# Patient Record
Sex: Female | Born: 1960 | Race: White | Hispanic: No | Smoking: Current every day smoker
Health system: Southern US, Community
[De-identification: ages and names within clinical notes are randomized; demographics above are authoritative.]

## PROBLEM LIST (undated history)

## (undated) DIAGNOSIS — K219 Gastro-esophageal reflux disease without esophagitis: Secondary | ICD-10-CM

## (undated) HISTORY — PX: TONSILLECTOMY: SUR1361

## (undated) HISTORY — PX: CERVICAL DISCECTOMY: SHX98

---

## 2006-11-23 ENCOUNTER — Ambulatory Visit (HOSPITAL_COMMUNITY): Payer: Self-pay | Admitting: Psychiatry

## 2008-08-20 ENCOUNTER — Encounter: Admission: RE | Admit: 2008-08-20 | Discharge: 2008-08-20 | Payer: Self-pay | Admitting: Obstetrics and Gynecology

## 2008-10-10 ENCOUNTER — Inpatient Hospital Stay (HOSPITAL_COMMUNITY): Admission: AD | Admit: 2008-10-10 | Discharge: 2008-10-10 | Payer: Self-pay | Admitting: Obstetrics and Gynecology

## 2008-11-23 ENCOUNTER — Encounter: Admission: RE | Admit: 2008-11-23 | Discharge: 2008-11-23 | Payer: Self-pay | Admitting: Orthopedic Surgery

## 2010-03-22 ENCOUNTER — Encounter (HOSPITAL_COMMUNITY): Payer: Self-pay | Admitting: Obstetrics and Gynecology

## 2010-06-06 LAB — DIFFERENTIAL
Basophils Absolute: 0.1 10*3/uL (ref 0.0–0.1)
Eosinophils Relative: 5 % (ref 0–5)
Lymphocytes Relative: 31 % (ref 12–46)
Lymphs Abs: 2.4 10*3/uL (ref 0.7–4.0)
Monocytes Absolute: 0.6 10*3/uL (ref 0.1–1.0)

## 2010-06-06 LAB — CBC
HCT: 41.7 % (ref 36.0–46.0)
Hemoglobin: 14.4 g/dL (ref 12.0–15.0)
RDW: 12.5 % (ref 11.5–15.5)

## 2014-08-18 ENCOUNTER — Ambulatory Visit: Payer: Self-pay | Admitting: Podiatry

## 2014-08-19 ENCOUNTER — Ambulatory Visit (INDEPENDENT_AMBULATORY_CARE_PROVIDER_SITE_OTHER): Payer: 59

## 2014-08-19 ENCOUNTER — Encounter: Payer: Self-pay | Admitting: Podiatry

## 2014-08-19 ENCOUNTER — Ambulatory Visit (INDEPENDENT_AMBULATORY_CARE_PROVIDER_SITE_OTHER): Payer: 59 | Admitting: Podiatry

## 2014-08-19 VITALS — BP 119/75 | HR 87 | Resp 16 | Ht 65.0 in | Wt 178.0 lb

## 2014-08-19 DIAGNOSIS — M722 Plantar fascial fibromatosis: Secondary | ICD-10-CM | POA: Diagnosis not present

## 2014-08-19 MED ORDER — MELOXICAM 7.5 MG PO TABS: 7.5000 mg | ORAL_TABLET | Freq: Every day | ORAL | Status: DC

## 2014-08-19 NOTE — Progress Notes (Signed)
   Subjective:    Patient ID: KIMBERLA HASELTINE, female    DOB: 10/07/1960, 54 y.o.   MRN: [id number]  HPI 54 year old female presents the office today with complaints of left heel pain which is been going on for couple of months. She states that she has pain in the morning which is relieved by ambulation however becoming more throbbing pain more consistently. She denies any history of injury or trauma denies any change or increase activities, onto the symptoms. She denies any numbness or tingling. The pain does not wake her up at night. Denies any swelling or redness. No other complaints at this time.   Review of Systems  All other systems reviewed and are negative.      Objective:   Physical Exam AAO x3, NAD DP/PT pulses palpable bilaterally, CRT less than 3 seconds Protective sensation intact with Simms Weinstein monofilament, vibratory sensation intact, Achilles tendon reflex intact Tenderness to palpation overlying the plantar medial tubercle of the calcaneus to left heel at the insertion of the plantar fascia. There is no pain along the course of plantar fascial within the arch of the foot. There is no pain with lateral compression of the calcaneus or pain the vibratory sensation. No pain on the posterior aspect of the calcaneus or along the course/insertion of the Achilles tendon. There is no overlying edema, erythema, increase in warmth. No other areas of tenderness palpation or pain with vibratory sensation to the foot/ankle. MMT 5/5, ROM WNL No open lesions or pre-ulcerative lesions are identified. No pain with calf compression, swelling, warmth, erythema.      Assessment & Plan:  54 year old female with left heel pain, likely plantar fasciitis -X-rays were obtained and reviewed with the patient.  -Discussed steroid injection however patient was told at this time. Prescribed mobic. Discussed side effects of the medication and directed to stop if any are to occur and call the office.    -Dispensed plantar fascial brace -Ice and stretching activities on a consistent basis. -Discussed shoe gear modifications and orthotics. -Treatment options discussed including all alternatives, risks, and complications -Follow-up 3 weeks or sooner if any problems arise. In the meantime, encouraged to call the office with any questions, concerns, change in symptoms.   Ovid Curd, DPM

## 2014-08-19 NOTE — Patient Instructions (Signed)
Plantar Fasciitis (Heel Spur Syndrome) with Rehab The plantar fascia is a fibrous, ligament-like, soft-tissue structure that spans the bottom of the foot. Plantar fasciitis is a condition that causes pain in the foot due to inflammation of the tissue. SYMPTOMS   Pain and tenderness on the underneath side of the foot.  Pain that worsens with standing or walking. CAUSES  Plantar fasciitis is caused by irritation and injury to the plantar fascia on the underneath side of the foot. Common mechanisms of injury include:  Direct trauma to bottom of the foot.  Damage to a small nerve that runs under the foot where the main fascia attaches to the heel bone.  Stress placed on the plantar fascia due to bone spurs. RISK INCREASES WITH:   Activities that place stress on the plantar fascia (running, jumping, pivoting, or cutting).  Poor strength and flexibility.  Improperly fitted shoes.  Tight calf muscles.  Flat feet.  Failure to warm-up properly before activity.  Obesity. PREVENTION  Warm up and stretch properly before activity.  Allow for adequate recovery between workouts.  Maintain physical fitness:  Strength, flexibility, and endurance.  Cardiovascular fitness.  Maintain a health body weight.  Avoid stress on the plantar fascia.  Wear properly fitted shoes, including arch supports for individuals who have flat feet. PROGNOSIS  If treated properly, then the symptoms of plantar fasciitis usually resolve without surgery. However, occasionally surgery is necessary. RELATED COMPLICATIONS   Recurrent symptoms that may result in a chronic condition.  Problems of the lower back that are caused by compensating for the injury, such as limping.  Pain or weakness of the foot during push-off following surgery.  Chronic inflammation, scarring, and partial or complete fascia tear, occurring more often from repeated injections. TREATMENT  Treatment initially involves the use of  ice and medication to help reduce pain and inflammation. The use of strengthening and stretching exercises may help reduce pain with activity, especially stretches of the Achilles tendon. These exercises may be performed at home or with a therapist. Your caregiver may recommend that you use heel cups of arch supports to help reduce stress on the plantar fascia. Occasionally, corticosteroid injections are given to reduce inflammation. If symptoms persist for greater than 6 months despite non-surgical (conservative), then surgery may be recommended.  MEDICATION   If pain medication is necessary, then nonsteroidal anti-inflammatory medications, such as aspirin and ibuprofen, or other minor pain relievers, such as acetaminophen, are often recommended.  Do not take pain medication within 7 days before surgery.  Prescription pain relievers may be given if deemed necessary by your caregiver. Use only as directed and only as much as you need.  Corticosteroid injections may be given by your caregiver. These injections should be reserved for the most serious cases, because they may only be given a certain number of times. HEAT AND COLD  Cold treatment (icing) relieves pain and reduces inflammation. Cold treatment should be applied for 10 to 15 minutes every 2 to 3 hours for inflammation and pain and immediately after any activity that aggravates your symptoms. Use ice packs or massage the area with a piece of ice (ice massage).  Heat treatment may be used prior to performing the stretching and strengthening activities prescribed by your caregiver, physical therapist, or athletic trainer. Use a heat pack or soak the injury in warm water. SEEK IMMEDIATE MEDICAL CARE IF:  Treatment seems to offer no benefit, or the condition worsens.  Any medications produce adverse side effects. EXERCISES RANGE   OF MOTION (ROM) AND STRETCHING EXERCISES - Plantar Fasciitis (Heel Spur Syndrome) These exercises may help you  when beginning to rehabilitate your injury. Your symptoms may resolve with or without further involvement from your physician, physical therapist or athletic trainer. While completing these exercises, remember:   Restoring tissue flexibility helps normal motion to return to the joints. This allows healthier, less painful movement and activity.  An effective stretch should be held for at least 30 seconds.  A stretch should never be painful. You should only feel a gentle lengthening or release in the stretched tissue. RANGE OF MOTION - Toe Extension, Flexion  Sit with your right / left leg crossed over your opposite knee.  Grasp your toes and gently pull them back toward the top of your foot. You should feel a stretch on the bottom of your toes and/or foot.  Hold this stretch for __________ seconds.  Now, gently pull your toes toward the bottom of your foot. You should feel a stretch on the top of your toes and or foot.  Hold this stretch for __________ seconds. Repeat __________ times. Complete this stretch __________ times per day.  RANGE OF MOTION - Ankle Dorsiflexion, Active Assisted  Remove shoes and sit on a chair that is preferably not on a carpeted surface.  Place right / left foot under knee. Extend your opposite leg for support.  Keeping your heel down, slide your right / left foot back toward the chair until you feel a stretch at your ankle or calf. If you do not feel a stretch, slide your bottom forward to the edge of the chair, while still keeping your heel down.  Hold this stretch for __________ seconds. Repeat __________ times. Complete this stretch __________ times per day.  STRETCH - Gastroc, Standing  Place hands on wall.  Extend right / left leg, keeping the front knee somewhat bent.  Slightly point your toes inward on your back foot.  Keeping your right / left heel on the floor and your knee straight, shift your weight toward the wall, not allowing your back to  arch.  You should feel a gentle stretch in the right / left calf. Hold this position for __________ seconds. Repeat __________ times. Complete this stretch __________ times per day. STRETCH - Soleus, Standing  Place hands on wall.  Extend right / left leg, keeping the other knee somewhat bent.  Slightly point your toes inward on your back foot.  Keep your right / left heel on the floor, bend your back knee, and slightly shift your weight over the back leg so that you feel a gentle stretch deep in your back calf.  Hold this position for __________ seconds. Repeat __________ times. Complete this stretch __________ times per day. STRETCH - Gastrocsoleus, Standing  Note: This exercise can place a lot of stress on your foot and ankle. Please complete this exercise only if specifically instructed by your caregiver.   Place the ball of your right / left foot on a step, keeping your other foot firmly on the same step.  Hold on to the wall or a rail for balance.  Slowly lift your other foot, allowing your body weight to press your heel down over the edge of the step.  You should feel a stretch in your right / left calf.  Hold this position for __________ seconds.  Repeat this exercise with a slight bend in your right / left knee. Repeat __________ times. Complete this stretch __________ times per day.    STRENGTHENING EXERCISES - Plantar Fasciitis (Heel Spur Syndrome)  These exercises may help you when beginning to rehabilitate your injury. They may resolve your symptoms with or without further involvement from your physician, physical therapist or athletic trainer. While completing these exercises, remember:   Muscles can gain both the endurance and the strength needed for everyday activities through controlled exercises.  Complete these exercises as instructed by your physician, physical therapist or athletic trainer. Progress the resistance and repetitions only as guided. STRENGTH -  Towel Curls  Sit in a chair positioned on a non-carpeted surface.  Place your foot on a towel, keeping your heel on the floor.  Pull the towel toward your heel by only curling your toes. Keep your heel on the floor.  If instructed by your physician, physical therapist or athletic trainer, add ____________________ at the end of the towel. Repeat __________ times. Complete this exercise __________ times per day. STRENGTH - Ankle Inversion  Secure one end of a rubber exercise band/tubing to a fixed object (table, pole). Loop the other end around your foot just before your toes.  Place your fists between your knees. This will focus your strengthening at your ankle.  Slowly, pull your big toe up and in, making sure the band/tubing is positioned to resist the entire motion.  Hold this position for __________ seconds.  Have your muscles resist the band/tubing as it slowly pulls your foot back to the starting position. Repeat __________ times. Complete this exercises __________ times per day.  Document Released: 02/14/2005 Document Revised: 05/09/2011 Document Reviewed: 05/29/2008 ExitCare Patient Information 2015 [employer]. This information is not intended to replace advice given to you by your health care provider. Make sure you discuss any questions you have with your health care provider.  

## 2014-09-09 ENCOUNTER — Ambulatory Visit: Payer: 59 | Admitting: Podiatry

## 2014-11-19 ENCOUNTER — Telehealth: Payer: Self-pay | Admitting: *Deleted

## 2014-11-19 MED ORDER — MELOXICAM 7.5 MG PO TABS: 7.5000 mg | ORAL_TABLET | Freq: Every day | ORAL | Status: AC

## 2014-11-19 NOTE — Telephone Encounter (Signed)
[facility] request refill of pt's Meloxicam.  Dr. Ardelle Anton ordered one refill, pt needs an appt.

## 2014-12-05 NOTE — Telephone Encounter (Signed)
entered in error   

## 2015-04-09 ENCOUNTER — Ambulatory Visit
Admission: EM | Admit: 2015-04-09 | Discharge: 2015-04-09 | Disposition: A | Discharge status: Home / Self Care | Payer: 59 | Attending: Family Medicine | Admitting: Family Medicine

## 2015-04-09 ENCOUNTER — Encounter: Payer: Self-pay | Admitting: Emergency Medicine

## 2015-04-09 DIAGNOSIS — J069 Acute upper respiratory infection, unspecified: Secondary | ICD-10-CM

## 2015-04-09 HISTORY — DX: Gastro-esophageal reflux disease without esophagitis: K21.9

## 2015-04-09 LAB — RAPID INFLUENZA A&B ANTIGENS (ARMC ONLY)
INFLUENZA A (ARMC): NOT DETECTED
INFLUENZA B (ARMC): NOT DETECTED

## 2015-04-09 MED ORDER — BENZONATATE 100 MG PO CAPS: 100.0000 mg | ORAL_CAPSULE | Freq: Three times a day (TID) | ORAL | Status: AC | PRN

## 2015-04-09 MED ORDER — OSELTAMIVIR PHOSPHATE 75 MG PO CAPS: 75.0000 mg | ORAL_CAPSULE | Freq: Two times a day (BID) | ORAL | Status: AC

## 2015-04-09 NOTE — Discharge Instructions (Signed)
Take medication as prescribed. Rest. Drink plenty of fluids. Take over the counter tylenol or ibuprofen as needed for pain or fever.   Follow up with your primary care physician this week as needed. Return to Urgent care for new or worsening concerns.    Upper Respiratory Infection, Adult Most upper respiratory infections (URIs) are a viral infection of the air passages leading to the lungs. A URI affects the nose, throat, and upper air passages. The most common type of URI is nasopharyngitis and is typically referred to as "the common cold." URIs run their course and usually go away on their own. Most of the time, a URI does not require medical attention, but sometimes a bacterial infection in the upper airways can follow a viral infection. This is called a secondary infection. Sinus and middle ear infections are common types of secondary upper respiratory infections. Bacterial pneumonia can also complicate a URI. A URI can worsen asthma and chronic obstructive pulmonary disease (COPD). Sometimes, these complications can require emergency medical care and may be life threatening.  CAUSES Almost all URIs are caused by viruses. A virus is a type of germ and can spread from one person to another.  RISKS FACTORS You may be at risk for a URI if:   You smoke.   You have chronic heart or lung disease.  You have a weakened defense (immune) system.   You are very young or very old.   You have nasal allergies or asthma.  You work in crowded or poorly ventilated areas.  You work in health care facilities or schools. SIGNS AND SYMPTOMS  Symptoms typically develop 2-3 days after you come in contact with a cold virus. Most viral URIs last 7-10 days. However, viral URIs from the influenza virus (flu virus) can last 14-18 days and are typically more severe. Symptoms may include:   Runny or stuffy (congested) nose.   Sneezing.   Cough.   Sore throat.   Headache.   Fatigue.   Fever.    Loss of appetite.   Pain in your forehead, behind your eyes, and over your cheekbones (sinus pain).  Muscle aches.  DIAGNOSIS  Your health care provider may diagnose a URI by:  Physical exam.  Tests to check that your symptoms are not due to another condition such as:  Strep throat.  Sinusitis.  Pneumonia.  Asthma. TREATMENT  A URI goes away on its own with time. It cannot be cured with medicines, but medicines may be prescribed or recommended to relieve symptoms. Medicines may help:  Reduce your fever.  Reduce your cough.  Relieve nasal congestion. HOME CARE INSTRUCTIONS   Take medicines only as directed by your health care provider.   Gargle warm saltwater or take cough drops to comfort your throat as directed by your health care provider.  Use a warm mist humidifier or inhale steam from a shower to increase air moisture. This may make it easier to breathe.  Drink enough fluid to keep your urine clear or pale yellow.   Eat soups and other clear broths and maintain good nutrition.   Rest as needed.   Return to work when your temperature has returned to normal or as your health care provider advises. You may need to stay home longer to avoid infecting others. You can also use a face mask and careful hand washing to prevent spread of the virus.  Increase the usage of your inhaler if you have asthma.   Do not use any  tobacco products, including cigarettes, chewing tobacco, or electronic cigarettes. If you need help quitting, ask your health care provider. PREVENTION  The best way to protect yourself from getting a cold is to practice good hygiene.   Avoid oral or hand contact with people with cold symptoms.   Wash your hands often if contact occurs.  There is no clear evidence that vitamin C, vitamin E, echinacea, or exercise reduces the chance of developing a cold. However, it is always recommended to get plenty of rest, exercise, and practice good  nutrition.  SEEK MEDICAL CARE IF:   You are getting worse rather than better.   Your symptoms are not controlled by medicine.   You have chills.  You have worsening shortness of breath.  You have brown or red mucus.  You have yellow or brown nasal discharge.  You have pain in your face, especially when you bend forward.  You have a fever.  You have swollen neck glands.  You have pain while swallowing.  You have white areas in the back of your throat. SEEK IMMEDIATE MEDICAL CARE IF:   You have severe or persistent:  Headache.  Ear pain.  Sinus pain.  Chest pain.  You have chronic lung disease and any of the following:  Wheezing.  Prolonged cough.  Coughing up blood.  A change in your usual mucus.  You have a stiff neck.  You have changes in your:  Vision.  Hearing.  Thinking.  Mood. MAKE SURE YOU:   Understand these instructions.  Will watch your condition.  Will get help right away if you are not doing well or get worse.   This information is not intended to replace advice given to you by your health care provider. Make sure you discuss any questions you have with your health care provider.   Document Released: 08/10/2000 Document Revised: 07/01/2014 Document Reviewed: 05/22/2013 Elsevier Interactive Patient Education [employer]

## 2015-04-09 NOTE — ED Provider Notes (Signed)
[facility]  ____________________________________________  Time seen: Approximately 9:41 AM  I have reviewed the triage vital signs and the nursing notes.   HISTORY  Chief Complaint Cough and Fever   HPI Adriana Watkins is a 55 y.o. female presents with a complaint of 2 days of runny nose, nasal congestion, cough, body aches and intermittent fever. Patient reports that fever maximum was 102 orally over the night last night. Patient reports that she last took Tylenol at 5 AM this morning. Patient reports that cough intermittently kept her up last night. Reports has continued to eat and drink well.  Denies known sick contacts, reports does work for the [employer]. Denies chest pain or shortness of breath, weakness, dizziness, abdominal pain, dysuria, rash, neck or back pain.  PCP: [facility]   Past Medical History  Diagnosis Date  . GERD (gastroesophageal reflux disease)     There are no active problems to display for this patient.   Past Surgical History  Procedure Laterality Date  . Tonsillectomy    . Cervical discectomy      Current Outpatient Rx  Name  Route  Sig  Dispense  Refill  . meloxicam (MOBIC) 7.5 MG tablet   Oral   Take 1 tablet (7.5 mg total) by mouth daily.   30 tablet   0   . Multiple Vitamins-Minerals (OCUVITE ADULT 50+ PO)   Oral   Take by mouth.         . Omega-3 Fatty Acids (FISH OIL) 1000 MG CAPS   Oral   Take by mouth.         .             Allergies Codeine  History reviewed. No pertinent family history.  Social History Social History  Substance Use Topics  . Smoking status: Current Every Day Smoker  . Smokeless tobacco: Never Used  . Alcohol Use: 0.0 oz/week    0 Standard drinks or equivalent per week    Review of Systems Constitutional: Positive fevers. Eyes: No visual changes. ENT: No sore throat. Positive runny nose, nasal congestion and cough. Cardiovascular: Denies chest pain. Respiratory: Denies  shortness of breath. Gastrointestinal: No abdominal pain.  No nausea, no vomiting.  No diarrhea.  No constipation. Genitourinary: Negative for dysuria. Musculoskeletal: Negative for back pain. Skin: Negative for rash. Neurological: Negative for headaches, focal weakness or numbness.  10-point ROS otherwise negative.  ____________________________________________   PHYSICAL EXAM:  VITAL SIGNS: ED Triage Vitals  Enc Vitals Group     BP 04/09/15 0937 137/90 mmHg     Pulse Rate 04/09/15 0937 88     Resp 04/09/15 0937 16     Temp 04/09/15 0937 98.7 F (37.1 C)     Temp Source 04/09/15 0937 Tympanic     SpO2 04/09/15 0937 97 %     Weight 04/09/15 0937 180 lb (81.647 kg)     Height 04/09/15 0937  (1.651 m)     Head Cir --      Peak Flow --      Pain Score 04/09/15 0939 3     Pain Loc --      Pain Edu? --      Excl. in GC? --     Constitutional: Alert and oriented. Well appearing and in no acute distress. Eyes: Conjunctivae are normal. PERRL. EOMI. Head: Atraumatic. No sinus tenderness to palpation. No swelling. No erythema.  Ears: no erythema, normal TMs bilaterally.   Nose: Nasal  congestion with clear rhinorrhea.  Mouth/Throat: Mucous membranes are moist.  Oropharynx non-erythematous. No tonsillar swelling or exudate. Neck: No stridor.  No cervical spine tenderness to palpation. Hematological/Lymphatic/Immunilogical: No cervical lymphadenopathy. Cardiovascular: Normal rate, regular rhythm. Grossly normal heart sounds.  Good peripheral circulation. Respiratory: Normal respiratory effort.  No retractions. Lungs CTAB. No wheezes, rales or rhonchi. Dry intermittent cough noted. Gastrointestinal: Soft and nontender.Normal Bowel sounds.  No CVA tenderness. Musculoskeletal: No lower or upper extremity tenderness nor edema.  No cervical, thoracic or lumbar tenderness to palpation. No calf tenderness bilaterally. Neurologic:  Normal speech and language. No gross focal neurologic  deficits are appreciated. No gait instability. Skin:  Skin is warm, dry and intact. No rash noted. Psychiatric: Mood and affect are normal. Speech and behavior are normal.  ____________________________________________   LABS (all labs ordered are listed, but only abnormal results are displayed)  Labs Reviewed  RAPID INFLUENZA A&B ANTIGENS ([facility] ONLY)  RAPID STREP SCREEN (NOT AT [facility])  URINALYSIS COMPLETEWITH MICROSCOPIC ([facility] ONLY)    INITIAL IMPRESSION / ASSESSMENT AND PLAN / ED COURSE  Pertinent labs & imaging results that were available during my care of the patient were reviewed by me and considered in my medical decision making (see chart for details).  Well-appearing patient. No acute distress. Presents for the complaints of 2 days of sudden onset of runny nose, nasal congestion, cough, congestion and fever. Reports continues to eat and drink well. Lungs clear throughout. Abdomen soft and nontender. Moist mucous membranes. Suspect viral upper respiratory infection such as influenza. Will evaluate for influenza.  Influenza negative, however concern for inaccurate result, suspect influenza. Will treat supportively and symptomatically. Encouraged rest, fluids, over-the-counter Tylenol or ibuprofen as needed for pain or fever. Will treat with oral Tamiflu and when necessary Tessalon Perles.   Discussed follow up with Primary care physician this week. Discussed follow up and return parameters including no resolution or any worsening concerns. Patient verbalized understanding and agreed to plan.   ____________________________________________   FINAL CLINICAL IMPRESSION(S) / ED DIAGNOSES  Final diagnoses:  Viral upper respiratory infection      Note: This dictation was prepared with Dragon dictation along with smaller phrase technology. Any transcriptional errors that result from this process are unintentional.    Renford Dills, NP 04/09/15 1030  Renford Dills,  NP 04/09/15 1031

## 2015-04-09 NOTE — ED Notes (Signed)
Patient c/o cough, chest congestion, bodyaches, and fever the past 2 days.  Patient states that she took Tylenol this morning.

## 2016-03-26 ENCOUNTER — Encounter: Payer: Self-pay | Admitting: Emergency Medicine

## 2016-03-26 ENCOUNTER — Emergency Department
Admission: EM | Admit: 2016-03-26 | Discharge: 2016-03-26 | Disposition: A | Discharge status: Home / Self Care | Payer: 59 | Attending: Emergency Medicine | Admitting: Emergency Medicine

## 2016-03-26 ENCOUNTER — Emergency Department: Payer: 59

## 2016-03-26 DIAGNOSIS — F17210 Nicotine dependence, cigarettes, uncomplicated: Secondary | ICD-10-CM | POA: Insufficient documentation

## 2016-03-26 DIAGNOSIS — R079 Chest pain, unspecified: Secondary | ICD-10-CM | POA: Insufficient documentation

## 2016-03-26 LAB — CBC
HCT: 40.9 % (ref 35.0–47.0)
HEMOGLOBIN: 14.5 g/dL (ref 12.0–16.0)
MCH: 33.5 pg (ref 26.0–34.0)
MCHC: 35.4 g/dL (ref 32.0–36.0)
MCV: 94.5 fL (ref 80.0–100.0)
PLATELETS: 403 10*3/uL (ref 150–440)
RBC: 4.32 MIL/uL (ref 3.80–5.20)
RDW: 13.1 % (ref 11.5–14.5)
WBC: 19.8 10*3/uL — ABNORMAL HIGH (ref 3.6–11.0)

## 2016-03-26 LAB — COMPREHENSIVE METABOLIC PANEL
ALBUMIN: 4.9 g/dL (ref 3.5–5.0)
ALK PHOS: 57 U/L (ref 38–126)
ALT: 19 U/L (ref 14–54)
ANION GAP: 9 (ref 5–15)
AST: 24 U/L (ref 15–41)
BUN: 13 mg/dL (ref 6–20)
CHLORIDE: 100 mmol/L — AB (ref 101–111)
CO2: 26 mmol/L (ref 22–32)
Calcium: 9.8 mg/dL (ref 8.9–10.3)
Creatinine, Ser: 0.55 mg/dL (ref 0.44–1.00)
GFR calc Af Amer: >60 mL/min (ref >60)
GFR calc non Af Amer: >60 mL/min (ref >60)
GLUCOSE: 102 mg/dL — AB (ref 65–99)
POTASSIUM: 3.4 mmol/L — AB (ref 3.5–5.1)
SODIUM: 135 mmol/L (ref 135–145)
Total Bilirubin: 1.3 mg/dL — ABNORMAL HIGH (ref 0.3–1.2)
Total Protein: 7.7 g/dL (ref 6.5–8.1)

## 2016-03-26 LAB — TROPONIN I

## 2016-03-26 LAB — LIPASE, BLOOD: Lipase: 28 U/L (ref 11–51)

## 2016-03-26 MED ORDER — HYDROCODONE-ACETAMINOPHEN 5-325 MG PO TABS: 1.0000 | ORAL_TABLET | ORAL | 0 refills | Status: AC | PRN

## 2016-03-26 MED ORDER — GI COCKTAIL ~~LOC~~
30.0000 mL | Freq: Once | ORAL | Status: AC
  Administered 2016-03-26: 30 mL via ORAL
  Filled 2016-03-26: qty 30

## 2016-03-26 MED ORDER — MORPHINE SULFATE (PF) 4 MG/ML IV SOLN
INTRAVENOUS | Status: AC
  Administered 2016-03-26: 4 mg
  Filled 2016-03-26: qty 1

## 2016-03-26 MED ORDER — IOPAMIDOL (ISOVUE-370) INJECTION 76%
75.0000 mL | Freq: Once | INTRAVENOUS | Status: AC | PRN
  Administered 2016-03-26: 75 mL via INTRAVENOUS

## 2016-03-26 MED ORDER — ONDANSETRON HCL 4 MG/2ML IJ SOLN
INTRAMUSCULAR | Status: AC
  Administered 2016-03-26: 4 mg
  Filled 2016-03-26: qty 2

## 2016-03-26 MED ORDER — CYCLOBENZAPRINE HCL 5 MG PO TABS: 5.0000 mg | ORAL_TABLET | Freq: Three times a day (TID) | ORAL | 0 refills | Status: AC | PRN

## 2016-03-26 NOTE — ED Provider Notes (Signed)
[facility] Emergency Department Provider Note  Time seen: 9:23 AM  I have reviewed the triage vital signs and the nursing notes.   HISTORY  Chief Complaint Chest Pain    HPI Adriana Watkins is a 56 y.o. female with a past medical history of gastric reflux who presents to the emergency department for central chest pain. According to the patient since last night she has been experiencing intermittent central chest pain which she states is worse any time she breathes or takes a deep breath in. Patient states the pain has become fairly constant today, moderate in severity with more sharp pain with inspiration.Patient denies any leg pain or swelling. Denies any history of chest pain in the past. Does state his significant family history including a father who had his first massive heart attack at 58 years old. Denies any cough, congestion, fever. Denies any shortness of breath nausea or diaphoresis. Patient states she took her blood pressure this morning and it was also elevated at approximately 150/110.  Past Medical History:  Diagnosis Date  . GERD (gastroesophageal reflux disease)     There are no active problems to display for this patient.   Past Surgical History:  Procedure Laterality Date  . CERVICAL DISCECTOMY    . TONSILLECTOMY      Prior to Admission medications   Medication Sig Start Date End Date Taking? Authorizing Provider  benzonatate (TESSALON PERLES) 100 MG capsule Take 1 capsule (100 mg total) by mouth 3 (three) times daily as needed for cough. 04/09/15   Renford DillsLindsey Miller, NP  meloxicam (MOBIC) 7.5 MG tablet Take 1 tablet (7.5 mg total) by mouth daily. 11/19/14   Vivi BarrackMatthew R Wagoner, DPM  Multiple Vitamins-Minerals (OCUVITE ADULT 50+ PO) Take by mouth.    Historical Provider, MD  Omega-3 Fatty Acids (FISH OIL) 1000 MG CAPS Take by mouth.    Historical Provider, MD  omeprazole (PRILOSEC) 40 MG capsule Take by mouth. 01/15/14 01/15/15  Historical  Provider, MD  oseltamivir (TAMIFLU) 75 MG capsule Take 1 capsule (75 mg total) by mouth every 12 (twelve) hours. 04/09/15   Renford DillsLindsey Miller, NP    Allergies  Allergen Reactions  . Codeine Rash and Nausea And Vomiting    Hall    No family history on file.  Social History Social History  Substance Use Topics  . Smoking status: Current Every Day Smoker    Packs/day: 0.25    Types: Cigarettes  . Smokeless tobacco: Never Used  . Alcohol use 0.0 oz/week    Review of Systems Constitutional: Negative for fever Cardiovascular: Positive for central chest pain worse with inspiration. Respiratory: Negative for shortness of breath. Gastrointestinal: Negative for abdominal pain Neurological: Negative for headache 10-point ROS otherwise negative.  ____________________________________________   PHYSICAL EXAM:  VITAL SIGNS: ED Triage Vitals  Enc Vitals Group     BP 03/26/16 0912 (!) 161/102     Pulse Rate 03/26/16 0912 100     Resp 03/26/16 0912 20     Temp 03/26/16 0912 97.7 F (36.5 C)     Temp Source 03/26/16 0912 Oral     SpO2 03/26/16 0912 95 %     Weight 03/26/16 0916 165 lb (74.8 kg)     Height 03/26/16 0916 5\' 5"  (1.651 m)     Head Circumference --      Peak Flow --      Pain Score 03/26/16 0916 8     Pain Loc --  Pain Edu? --      Excl. in GC? --     Constitutional: Alert and oriented. Well appearing and in no distress. Eyes: Normal exam ENT   Head: Normocephalic and atraumatic.   Mouth/Throat: Mucous membranes are moist. Cardiovascular: Normal rate, regular rhythm. No murmur Respiratory: Normal respiratory effort without tachypnea nor retractions. Breath sounds are clear Gastrointestinal: Soft and nontender. No distention.   Musculoskeletal: Nontender with normal range of motion in all extremities. No lower extremity tenderness or edema. Neurologic:  Normal speech and language. No gross focal neurologic deficits  Skin:  Skin is warm, dry and intact.   Psychiatric: Mood and affect are normal.  ____________________________________________    EKG  EKG reviewed and interpreted by myself shows normal sinus rhythm at 97 bpm. Narrow QRS, normal axis, normal intervals, nonspecific ST changes without ST elevation.  ____________________________________________    RADIOLOGY  CT negative  ____________________________________________   INITIAL IMPRESSION / ASSESSMENT AND PLAN / ED COURSE  Pertinent labs & imaging results that were available during my care of the patient were reviewed by me and considered in my medical decision making (see chart for details).  The patient presents to the emergency department with central chest pain worse with inspiration. No associated symptoms such as nausea, diaphoresis or shortness of breath. Patient denies any personal history of cardiac disease in the past but does have significant family history. Patient received aspirin prior to arrival. EKG does not appear to show any concerning findings at this time. Patient overall appears well, no distress. Given the patient's tachycardia with pleuritic chest pain we will proceed with a CT angiography of the chest in addition to lab work including troponin and lipase to further evaluate.  CT scan is negative for PE or other acute process. Patient's troponin is negative, labs show a leukocytosis of 19,000 and otherwise within normal limits. Overall the patient appears well, afebrile. Continues to have some discomfort although improved from earlier. In pushing on the chest the chest pain appears to be very reproducible in one particular area. Possible muscular skeletal discomfort. I discussed with the patient a short course of pain medication and follow-up Monday with a cardiologist to arrange a stress test. Patient is agreeable to this plan. I also discussed very strict return precautions for any worsening abdominal pain, nausea or diaphoresis or shortness of breath.  Patient is agreeable.  ____________________________________________   FINAL CLINICAL IMPRESSION(S) / ED DIAGNOSES  Chest pain    Minna Antis, MD 03/26/16 1116

## 2016-03-26 NOTE — ED Triage Notes (Signed)
Chest pain began yesterday at 7p, became constant this am, took 324 aspirin on EMS reccomendation when she called 911. FSBS 109 for EMS.

## 2016-03-26 NOTE — Discharge Instructions (Signed)
You have been seen in the emergency department today for chest pain. Your workup has shown normal results. As we discussed please follow-up with your primary care physician in the next 1-2 days for recheck. Return to the emergency department for any further chest pain, trouble breathing, or any other symptom personally concerning to yourself.  Please call the number provided for cardiology to arrange a follow-up appointment as it is possible to arrange further workup including stress test.

## 2016-03-28 ENCOUNTER — Telehealth: Payer: Self-pay

## 2016-03-28 NOTE — Telephone Encounter (Signed)
Lmov for patient was seen in ED on 03/26/16 for CP  Will try again at a later time

## 2016-03-29 NOTE — Telephone Encounter (Signed)
Lmov for patient was seen in ED on 03/26/16 for CP  °Will try again at a later time ° °

## 2016-03-30 NOTE — Telephone Encounter (Signed)
Sent letter

## 2016-03-31 ENCOUNTER — Telehealth: Payer: Self-pay

## 2016-03-31 NOTE — Telephone Encounter (Signed)
Pt returned out call regarding scheduling an ED f/u. Pt states she is going to follow up with her PCP.

## 2017-07-15 IMAGING — CT CT ANGIO CHEST
2 of 6 series · 19 of 46 positions shown · IV contrast (APPLIED)
Comparison: None.

CLINICAL DATA: Chest pain.

EXAM:
CT ANGIOGRAPHY CHEST WITH CONTRAST
TECHNIQUE: Multidetector CT imaging of the chest was performed using the
standard protocol during bolus administration of intravenous
contrast. Multiplanar CT image reconstructions and MIPs were
obtained to evaluate the vascular anatomy.
CONTRAST:  75 cc Isovue 370 IV

[Series 5: thins · axial · 0.72mm/px · z∈[-553,-306]mm · 17 of 271 slices shown]
[im 12/271  lung]
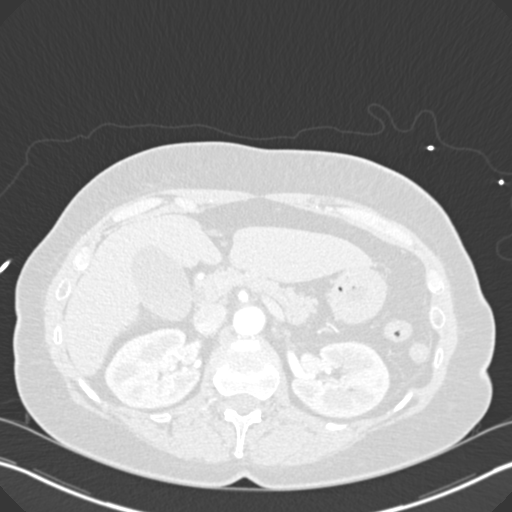
[im 24/271  soft-tissue]
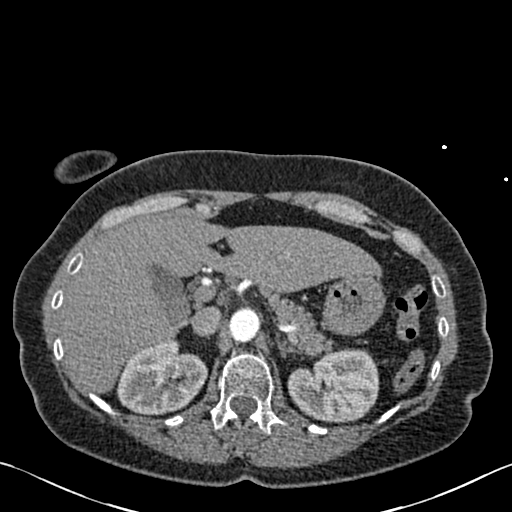
[im 47/271  lung]
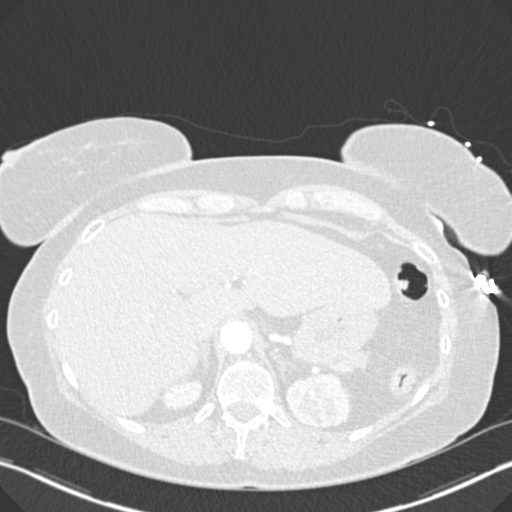
[im 59/271  soft-tissue]
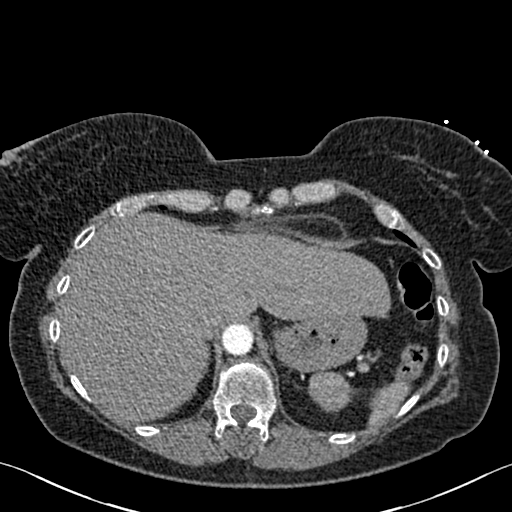
[im 71/271  lung]
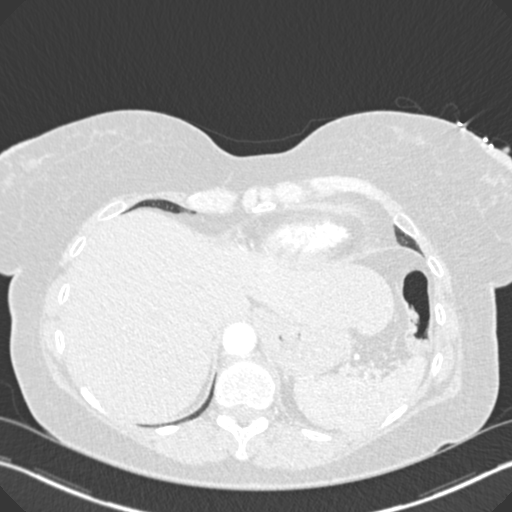
[im 94/271  soft-tissue]
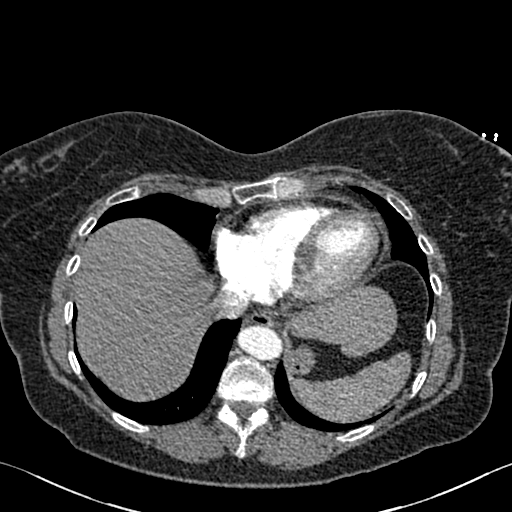
[im 106/271  lung]
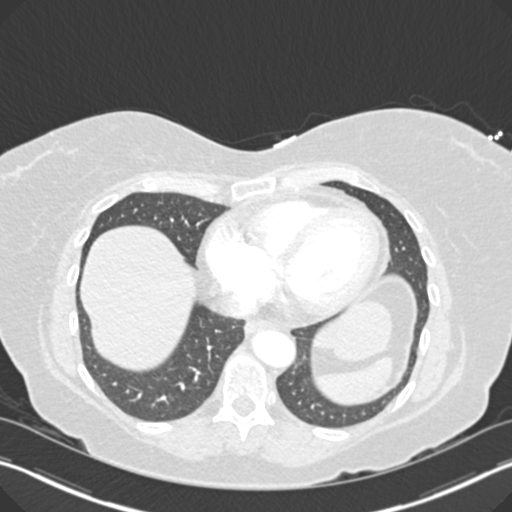
[im 118/271  soft-tissue]
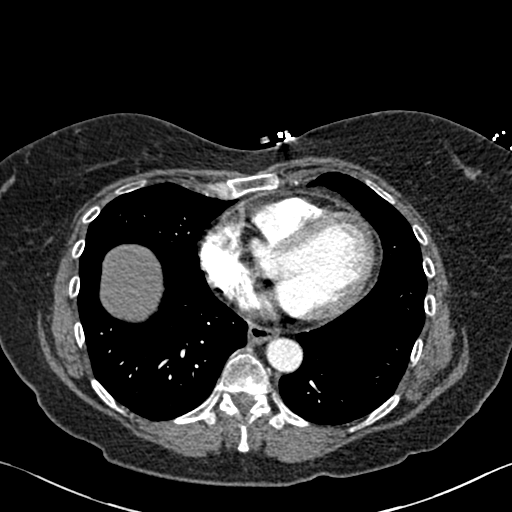
[im 141/271  lung]
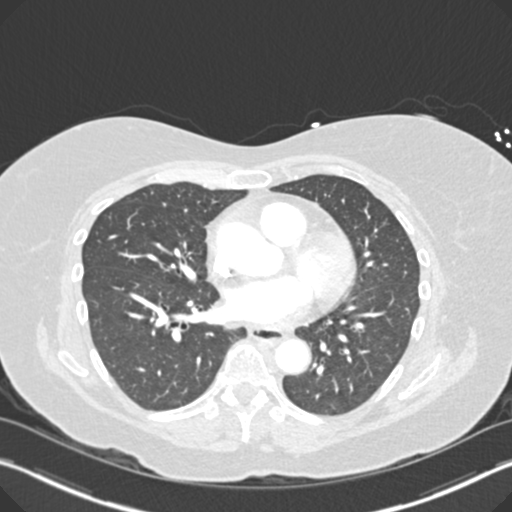
[im 153/271  soft-tissue]
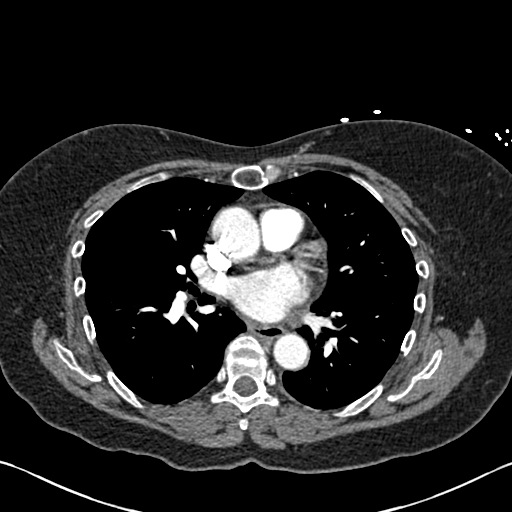
[im 165/271  lung]
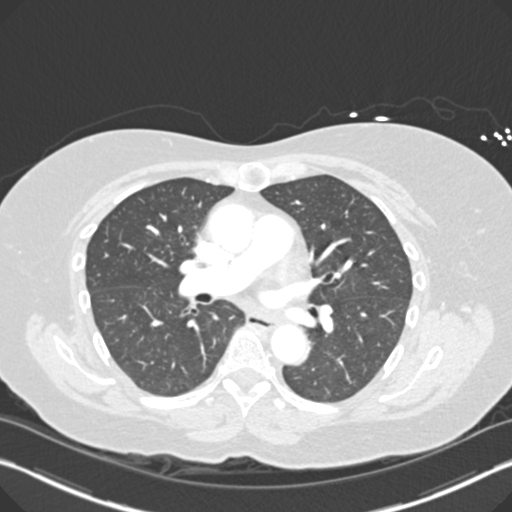
[im 177/271  soft-tissue]
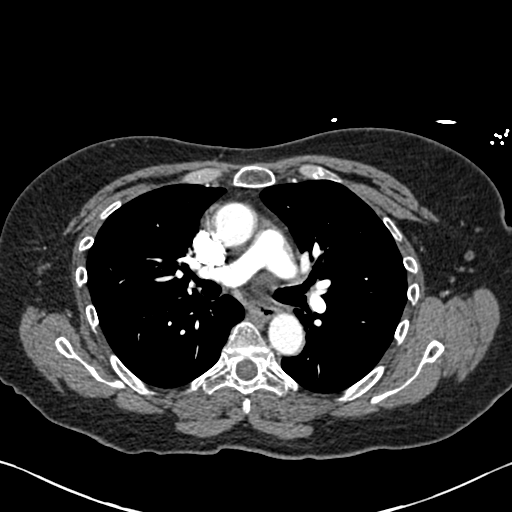
[im 200/271  lung]
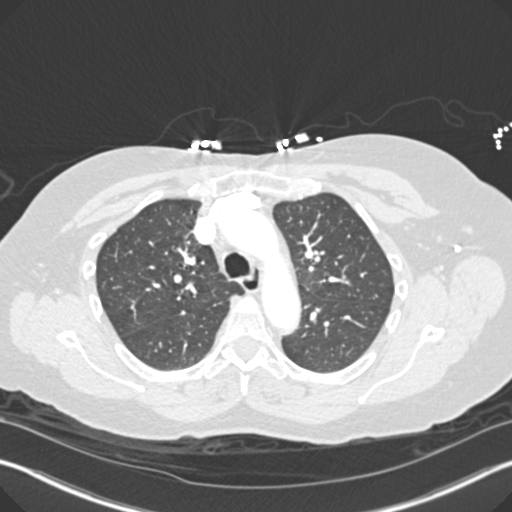
[im 212/271  soft-tissue]
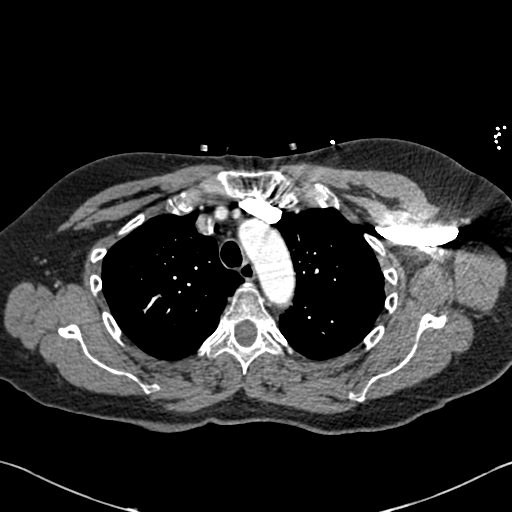
[im 224/271  lung]
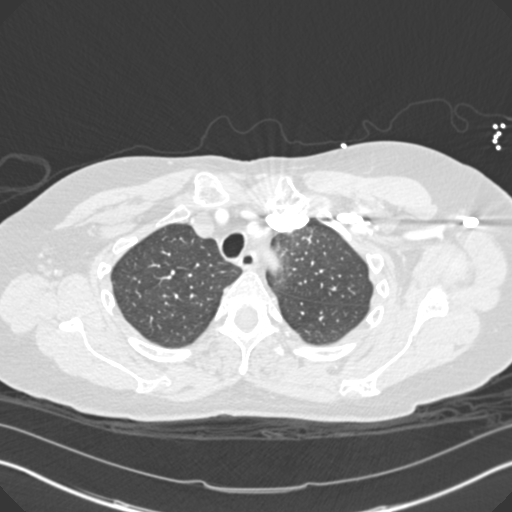
[im 247/271  soft-tissue]
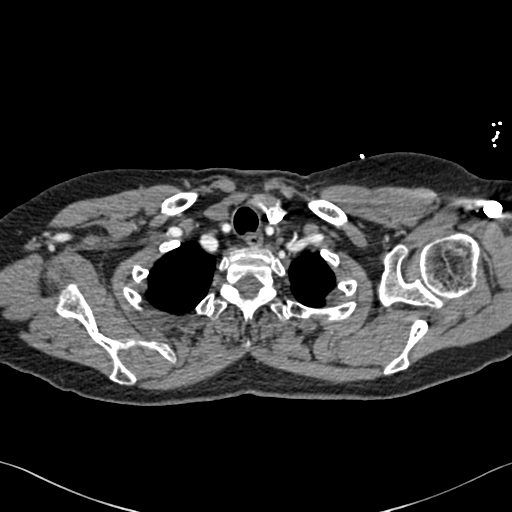
[im 259/271  lung]
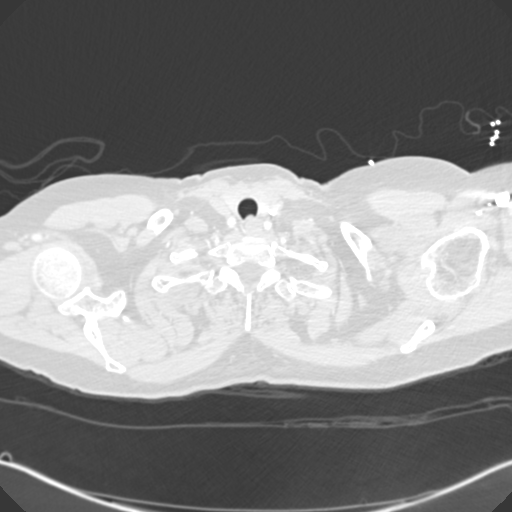

[Series 7: coronal mpr · coronal · 0.53mm/px · 2 of 79 slices shown]
[im 27/79  soft-tissue]
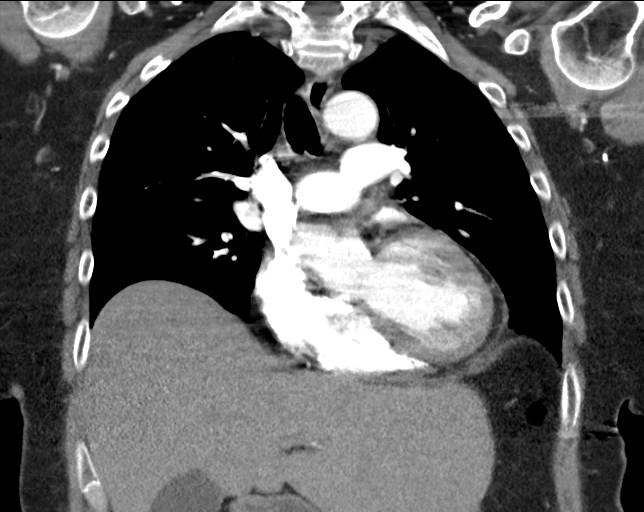
[im 53/79  soft-tissue]
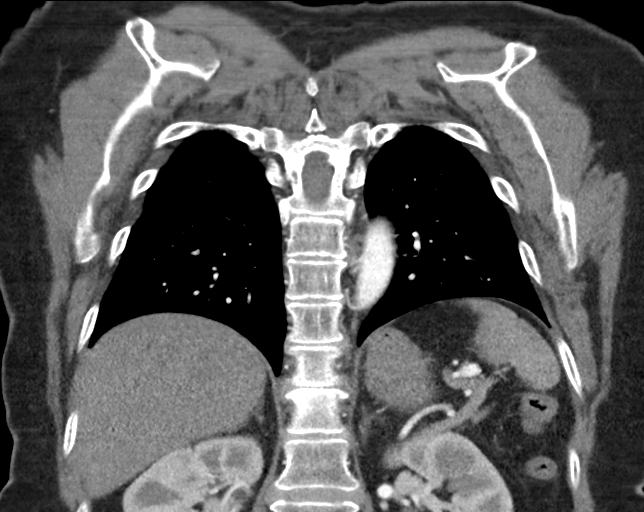

[19 of 46 positions shown; findings below may reference images not displayed]

FINDINGS: Cardiovascular: No evidence of pulmonary embolus. Heart is normal
size. Aorta is normal caliber.

Mediastinum/Nodes: No mediastinal, hilar, or axillary adenopathy.

Lungs/Pleura: Lungs are clear. No focal airspace opacities or
suspicious nodules. No effusions.

Upper Abdomen: Imaging into the upper abdomen shows no acute
findings.

Musculoskeletal: Chest wall soft tissues are unremarkable. No acute
bony abnormality or focal bone lesion.

Review of the MIP images confirms the above findings.
IMPRESSION: No evidence of pulmonary embolus.

No acute cardiopulmonary disease.
